# Patient Record
Sex: Female | Born: 1995 | Race: Black or African American | Hispanic: No | Marital: Single | Smoking: Never smoker
Health system: Southern US, Community
[De-identification: ages and names within clinical notes are randomized; demographics above are authoritative.]

---

## 2014-05-10 ENCOUNTER — Ambulatory Visit (INDEPENDENT_AMBULATORY_CARE_PROVIDER_SITE_OTHER): Payer: Managed Care, Other (non HMO) | Admitting: Family Medicine

## 2014-05-10 VITALS — BP 112/73 | HR 70 | Temp 97.9°F | Resp 18 | Ht 65.0 in | Wt 145.0 lb

## 2014-05-10 DIAGNOSIS — Z0000 Encounter for general adult medical examination without abnormal findings: Secondary | ICD-10-CM

## 2014-05-10 DIAGNOSIS — Z13220 Encounter for screening for lipoid disorders: Secondary | ICD-10-CM

## 2014-05-10 DIAGNOSIS — Z23 Encounter for immunization: Secondary | ICD-10-CM

## 2014-05-10 DIAGNOSIS — N898 Other specified noninflammatory disorders of vagina: Secondary | ICD-10-CM

## 2014-05-10 LAB — POCT WET PREP WITH KOH
Clue Cells Wet Prep HPF POC: NEGATIVE
KOH Prep POC: NEGATIVE
TRICHOMONAS UA: NEGATIVE
YEAST WET PREP PER HPF POC: NEGATIVE

## 2014-05-10 MED ORDER — FLUCONAZOLE 150 MG PO TABS: 150.0000 mg | ORAL_TABLET | Freq: Once | ORAL | Status: AC

## 2014-05-10 NOTE — Patient Instructions (Signed)
Come in for your second gardasil when it is due.  You got your Tdap today- this is tetanus as well as pertussis (whooping cough).    I will be in touch with the rest of your labs Your wet prep looks ok,  However I am going to go ahead and treat you for a yeast infection with diflucan.  Let me know if your symptoms do not then resolve

## 2014-05-10 NOTE — Progress Notes (Signed)
[facility] [address], [place] [id number] [phone]  Date:  05/10/2014   Name:  Leslie Dougherty   DOB:  08/15/1996   MRN:  [id number]  PCP:  No PCP Per Patient    Chief Complaint: Annual Exam   History of Present Illness:  Leslie Dougherty is a 18 y.o. very pleasant female patient who presents with the following:  She is here today for a PE.  She is generally quite healthy.  She states that she was seen at OBG a couple of weeks ago, had an exam and was treated for BV.  She took it BID for one week.  Her vaginal sx got better but did not totally resolve.  She notes some creamy white discharge and itching   LMP 04/19/14.   No surgical history.   She is a Arts development officercomputer engineering student at [employer]  She is in summer session right now.    She last had sex about 6 months ago- she had a negative GC/ CMZ per her OBG just recently She had menactra prior to starting school, but her last tetanus was in 2002.  She is sure she did NOT have one prior to college She would also like to start Gardasil today   She last ate right before coming in today.   She also plans to participate in marching band and needs a PE for sports.  She has no history of heart or lung problems. Is able to exercise without difficulty.  No orthopedic issues.  She needs a sickle cell screen but plans to have this done at student health next week  There are no active problems to display for this patient.   History reviewed. No pertinent past medical history.  History reviewed. No pertinent past surgical history.  History  Substance Use Topics  . Smoking status: Never Smoker   . Smokeless tobacco: Not on file  . Alcohol Use: No    Family History  Problem Relation Age of Onset  . Diabetes Mother   . Diabetes Maternal Grandmother   . Heart disease Maternal Grandmother   . Hyperlipidemia Maternal Grandmother   . Hypertension Maternal Grandmother   . Diabetes Maternal Grandfather   . Heart  disease Maternal Grandfather   . Hyperlipidemia Maternal Grandfather   . Hypertension Maternal Grandfather     No Known Allergies  Medication list has been reviewed and updated.  No current outpatient prescriptions on file prior to visit.   No current facility-administered medications on file prior to visit.    Review of Systems:  As per HPI- otherwise negative.   Physical Examination: Filed Vitals:   05/10/14 1458  BP: 112/73  Pulse: 100  Temp: 97.9 F (36.6 C)  Resp: 18   Filed Vitals:   05/10/14 1458  Height: 5\' 5"  (1.651 m)  Weight: 145 lb (65.772 kg)   Body mass index is 24.13 kg/(m^2). Ideal Body Weight: Weight in (lb) to have BMI = 25: 149.9  GEN: WDWN, NAD, Non-toxic, A & O x 3, slim build, looks well HEENT: Atraumatic, Normocephalic. Neck supple. No masses, No LAD.  Bilateral TM wnl, oropharynx normal.  PEERL,EOMI.   Ears and Nose: No external deformity. CV: RRR, No M/G/R. No JVD. No thrill. No extra heart sounds. PULM: CTA B, no wheezes, crackles, rhonchi. No retractions. No resp. distress. No accessory muscle use. ABD: S, NT, ND, +BS. No rebound. No HSM. EXTR: No c/c/e NEURO Normal gait.  PSYCH: Normally  interactive. Conversant. Not depressed or anxious appearing.  Calm demeanor.   Did a self- collect wet prep:  Results for orders placed in visit on 05/10/14  POCT WET PREP WITH KOH      Result Value Ref Range   Trichomonas, UA Negative     Clue Cells Wet Prep HPF POC neg     Epithelial Wet Prep HPF POC 12-15     Yeast Wet Prep HPF POC neg     Bacteria Wet Prep HPF POC trace     RBC Wet Prep HPF POC 0-3     WBC Wet Prep HPF POC 3-6     KOH Prep POC Negative      Assessment and Plan: Vaginal discharge - Plan: POCT Wet Prep with KOH, fluconazole (DIFLUCAN) 150 MG tablet  Screening for hyperlipidemia - Plan: LDL cholesterol, direct  Physical exam - Plan: CBC, Comprehensive metabolic panel  Immunization due - Plan: Tdap vaccine greater than or  equal to 7yo IM, HPV vaccine quadravalent 3 dose IM  Treat with diflucan for suspect yeast vaginitis. She had a recent exam per OBG and negative genprobe. Completed sports form for her today.  Await labs and will follow-up. She will let me know if her sx do not resolve.    Signed Abbe AmsterdamJessica Karis Rilling, MD

## 2014-05-11 LAB — COMPREHENSIVE METABOLIC PANEL
ALT: 10 U/L (ref 0–35)
AST: 19 U/L (ref 0–37)
Albumin: 4.1 g/dL (ref 3.5–5.2)
Alkaline Phosphatase: 70 U/L (ref 39–117)
BUN: 13 mg/dL (ref 6–23)
CO2: 26 meq/L (ref 19–32)
CREATININE: 0.67 mg/dL (ref 0.50–1.10)
Calcium: 9.5 mg/dL (ref 8.4–10.5)
Chloride: 104 mEq/L (ref 96–112)
Glucose, Bld: 84 mg/dL (ref 70–99)
Potassium: 4.3 mEq/L (ref 3.5–5.3)
Sodium: 138 mEq/L (ref 135–145)
Total Bilirubin: 0.4 mg/dL (ref 0.2–1.1)
Total Protein: 7 g/dL (ref 6.0–8.3)

## 2014-05-11 LAB — CBC
HCT: 37.4 % (ref 36.0–46.0)
Hemoglobin: 12.7 g/dL (ref 12.0–15.0)
MCH: 28.1 pg (ref 26.0–34.0)
MCHC: 34 g/dL (ref 30.0–36.0)
MCV: 82.7 fL (ref 78.0–100.0)
PLATELETS: 314 10*3/uL (ref 150–400)
RBC: 4.52 MIL/uL (ref 3.87–5.11)
RDW: 15.2 % (ref 11.5–15.5)
WBC: 4.3 10*3/uL (ref 4.0–10.5)

## 2014-05-11 LAB — LDL CHOLESTEROL, DIRECT: Direct LDL: 96 mg/dL

## 2014-05-12 ENCOUNTER — Encounter: Payer: Self-pay | Admitting: Family Medicine

## 2014-06-19 ENCOUNTER — Ambulatory Visit (INDEPENDENT_AMBULATORY_CARE_PROVIDER_SITE_OTHER): Payer: Managed Care, Other (non HMO) | Admitting: Physician Assistant

## 2014-06-19 VITALS — BP 110/72 | HR 86 | Temp 98.3°F | Resp 18 | Ht 65.0 in | Wt 142.0 lb

## 2014-06-19 DIAGNOSIS — Z23 Encounter for immunization: Secondary | ICD-10-CM

## 2014-06-19 NOTE — Progress Notes (Signed)
Patient here for Gardasil #2. OK to give. Not seen by a provider

## 2014-06-19 NOTE — Progress Notes (Signed)
Patient here for 2nd HPV.

## 2016-01-04 ENCOUNTER — Ambulatory Visit (INDEPENDENT_AMBULATORY_CARE_PROVIDER_SITE_OTHER): Payer: Managed Care, Other (non HMO) | Admitting: Internal Medicine

## 2016-01-04 ENCOUNTER — Ambulatory Visit (INDEPENDENT_AMBULATORY_CARE_PROVIDER_SITE_OTHER): Payer: Managed Care, Other (non HMO)

## 2016-01-04 VITALS — BP 110/70 | HR 90 | Temp 98.4°F | Ht 66.0 in | Wt 156.0 lb

## 2016-01-04 DIAGNOSIS — M25571 Pain in right ankle and joints of right foot: Secondary | ICD-10-CM

## 2016-01-04 DIAGNOSIS — M25561 Pain in right knee: Secondary | ICD-10-CM

## 2016-01-04 DIAGNOSIS — S82401A Unspecified fracture of shaft of right fibula, initial encounter for closed fracture: Secondary | ICD-10-CM

## 2016-01-04 NOTE — Progress Notes (Signed)
° °  Subjective:  By signing my name below, I, Stann Ore, attest that this documentation has been prepared under the direction and in the presence of Ellamae Sia, MD. Electronically Signed: Stann Ore, Scribe. 01/04/2016 , 1:25 PM .  Patient was seen in Room 2 .   Patient ID: Leslie Dougherty, female    DOB: 10/08/1996, 20 y.o.   MRN: [id number] Chief Complaint  Patient presents with   Ankle Injury    right ankle pain after a fell which was dancing yesterday   HPI Leslie Dougherty is a 20 y.o. female who presents to [facility]: [facility] complaining of right ankle pain due to an acute injury yesterday. She was dancing and fell. She also reports having right knee pain. She was unable to walk and put pressure on her right foot today. She's using crutches to assist with mobility. She mentions that she tore a ligament in her right ankle a few years ago.   There are no active problems to display for this patient.   Current outpatient prescriptions:    fluconazole (DIFLUCAN) 150 MG tablet, Take 1 tablet (150 mg total) by mouth once. 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  Musculoskeletal: Positive for myalgias, joint swelling, arthralgias and gait problem. Negative for back pain.  Skin: Negative for rash and wound.  Neurological: Negative for dizziness, weakness, numbness and headaches.      Objective:   Physical Exam  Constitutional: She is oriented to person, place, and time. She appears well-developed and well-nourished. No distress.  HENT:  Head: Normocephalic and atraumatic.  Eyes: EOM are normal. Pupils are equal, round, and reactive to light.  Neck: Neck supple.  Cardiovascular: Normal rate.   Pulmonary/Chest: Effort normal. No respiratory distress.  Musculoskeletal: Normal range of motion.  Right knee is not swollen although it is tender  along the medial aspect of the patellar border. No laxities to stress or. Negative McMurray. In the right ankle is swollen and markedly over the distal fibula/lateral malleolus with tenderness along the bone. No ankle laxity. Achilles intact. Fifth metatarsal intact.   Neurological: She is alert and oriented to person, place, and time.  Skin: Skin is warm and dry.  Psychiatric: She has a normal mood and affect. Her behavior is normal.  Nursing note and vitals reviewed.  BP 110/70 mmHg   Pulse 90   Temp(Src) 98.4 F (36.9 C) (Oral)   Ht  (1.676 m)   Wt 156 lb (70.761 kg)   BMI 25.19 kg/m2   SpO2 98%   LMP 01/04/2016  [facility] reading (PRIMARY) by Dr. Merla Riches :displ fx fibula//knee ok     Assessment & Plan:   Fx shaft fibula-closed, right, initial encounter  Pain in right knee - ? Dislocation relocation of the patella///will need quads strengthening as ankle heals  Pain in right ankle - secondary to fracture  Cam/crutches/avoid wt bearing as much as possible until orthopedic evaluation [facility] Ice and ibuprofen

## 2016-01-04 NOTE — Patient Instructions (Signed)
Because you received an x-ray today, you will receive an invoice from [facility]. Please contact [facility] at [phone] with questions or concerns regarding your invoice. Our billing staff will not be able to assist you with those questions. °

## 2017-08-24 IMAGING — CR DG KNEE COMPLETE 4+V*R*
4 series · 4 of 4 positions shown · non-contrast
Comparison: None.

CLINICAL DATA: Fall yesterday.  Knee pain

EXAM:
RIGHT KNEE - COMPLETE 4+ VIEW

[AP]
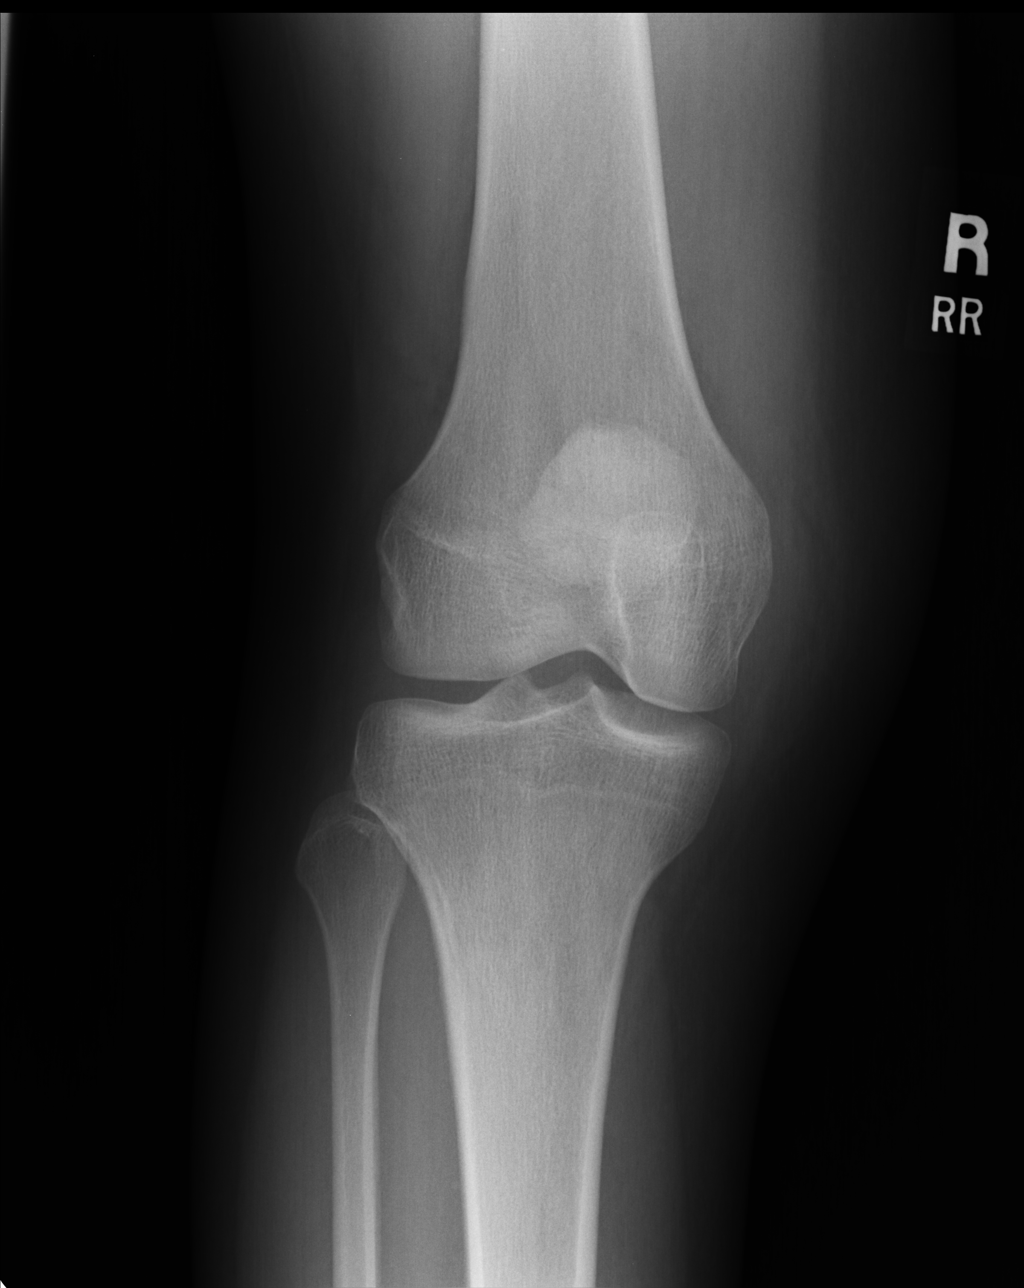

[lateral]
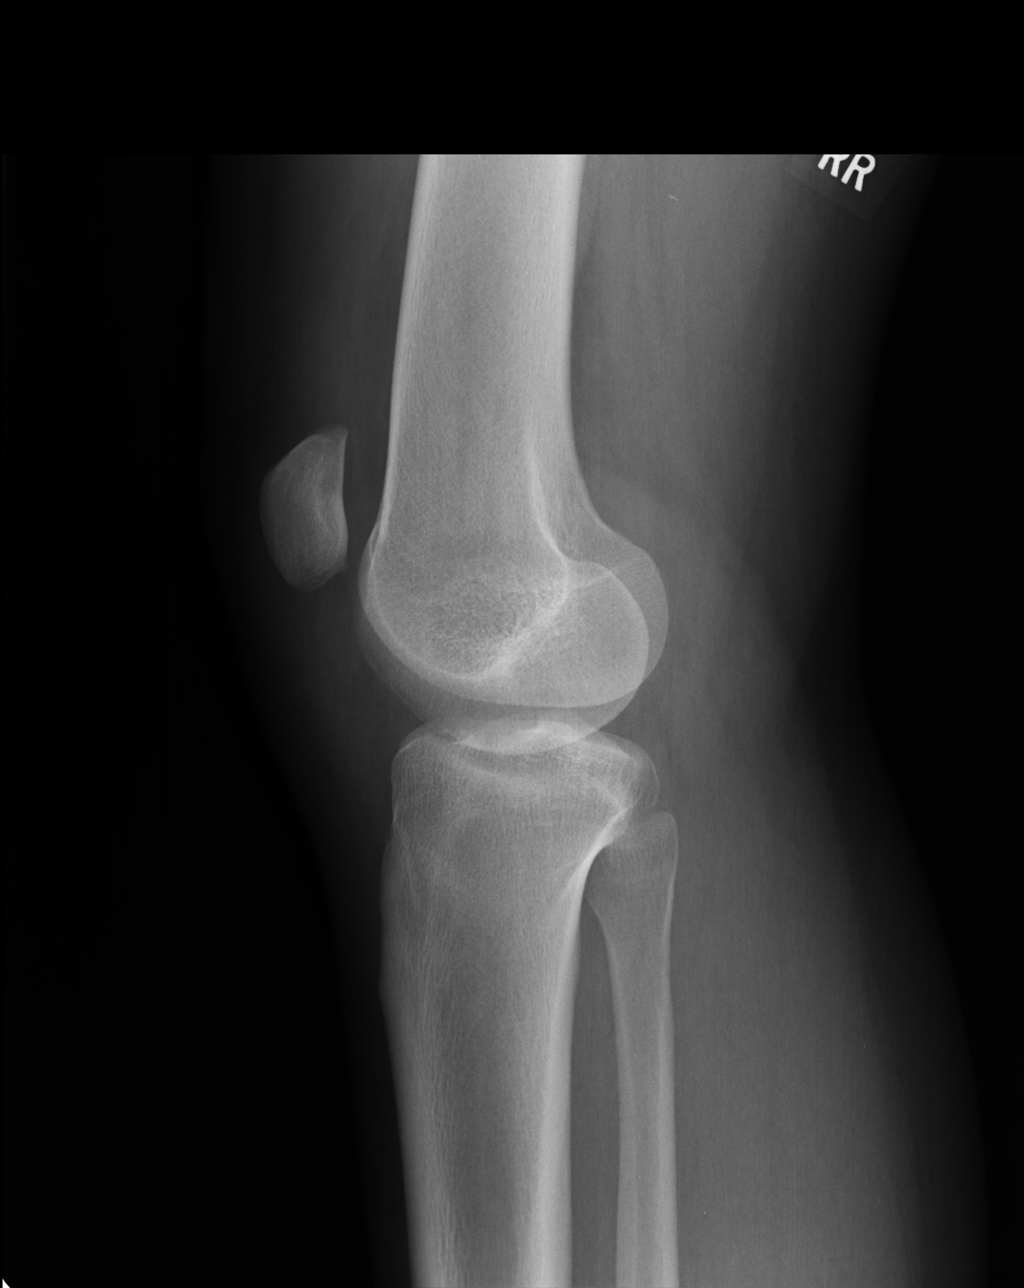

[pa axial]
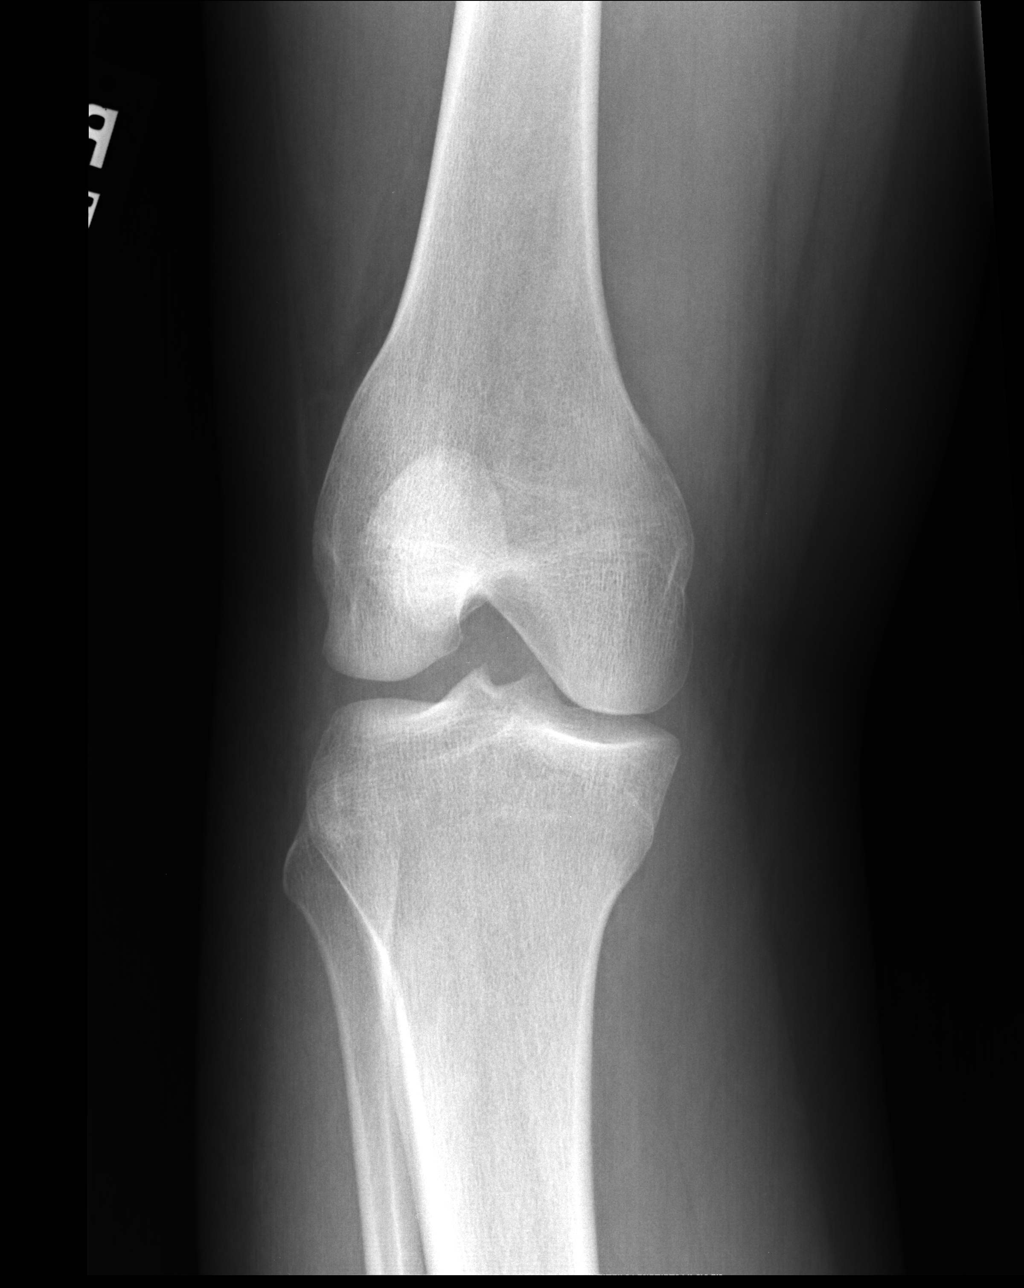

[sunrise]
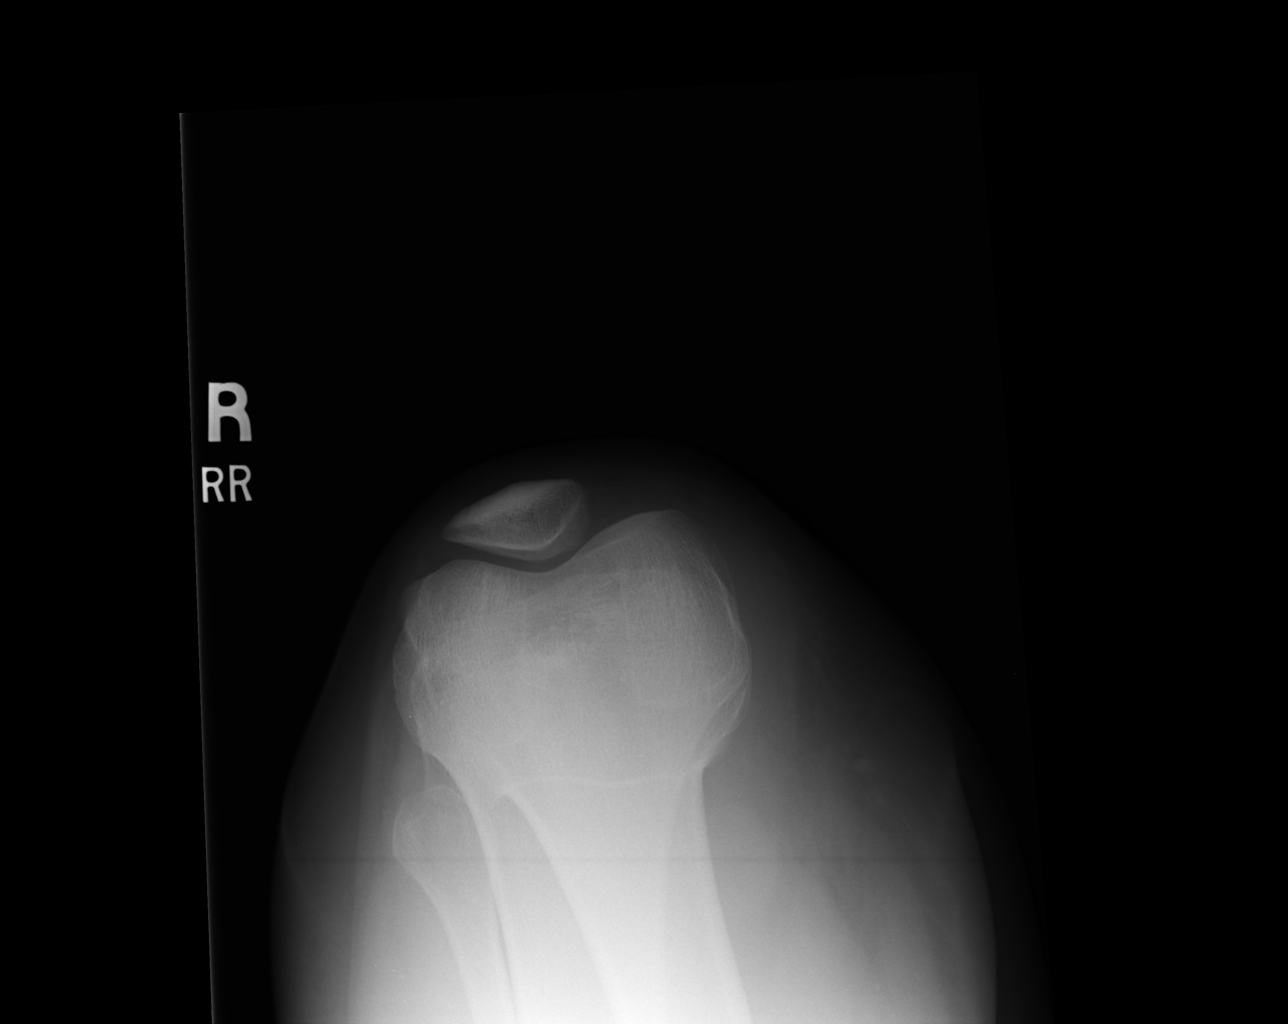

[4 of 4 positions shown; findings below may reference images not displayed]

FINDINGS: There is no evidence of fracture, dislocation, or joint effusion.
There is no evidence of arthropathy or other focal bone abnormality.
Soft tissues are unremarkable.
IMPRESSION: Negative.

## 2020-02-09 ENCOUNTER — Ambulatory Visit: Payer: Self-pay | Attending: Family

## 2020-02-09 DIAGNOSIS — Z23 Encounter for immunization: Secondary | ICD-10-CM

## 2020-02-09 NOTE — Progress Notes (Signed)
   Covid-19 Vaccination Clinic  Name:  Leslie Dougherty    MRN: [id number] DOB: [birth year]  02/09/2020  [name] was observed post Covid-19 immunization for 15 minutes without incident. She was provided with Vaccine Information Sheet and instruction to access the V-Safe system.   [name] was instructed to call 911 with any severe reactions post vaccine: Marland Kitchen Difficulty breathing  . Swelling of face and throat  . A fast heartbeat  . A bad rash all over body  . Dizziness and weakness   Immunizations Administered    Name Date Dose VIS Date Route   JANSSEN COVID-19 VACCINE 02/09/2020  1:26 PM 0.5 mL 01/05/2020 Intramuscular   Manufacturer: [employer]   Lot: [id number]   NDC: 33582-518-98
